# Patient Record
Sex: Male | Born: 1995 | Race: White | Hispanic: No | Marital: Single | Smoking: Never smoker
Health system: Southern US, Community
[De-identification: ages and names within clinical notes are randomized; demographics above are authoritative.]

---
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
## 2019-09-13 DIAGNOSIS — Z20828 Contact with and (suspected) exposure to other viral communicable diseases: Secondary | ICD-10-CM | POA: Diagnosis not present

## 2019-12-15 ENCOUNTER — Encounter: Payer: Self-pay | Admitting: Family Medicine

## 2019-12-15 ENCOUNTER — Ambulatory Visit (INDEPENDENT_AMBULATORY_CARE_PROVIDER_SITE_OTHER): Payer: BC Managed Care – PPO | Admitting: Family Medicine

## 2019-12-15 ENCOUNTER — Other Ambulatory Visit: Payer: Self-pay

## 2019-12-15 VITALS — BP 120/68 | HR 94 | Temp 98.2°F | Ht 72.5 in | Wt 170.2 lb

## 2019-12-15 DIAGNOSIS — K4021 Bilateral inguinal hernia, without obstruction or gangrene, recurrent: Secondary | ICD-10-CM | POA: Diagnosis not present

## 2019-12-15 DIAGNOSIS — Z114 Encounter for screening for human immunodeficiency virus [HIV]: Secondary | ICD-10-CM | POA: Diagnosis not present

## 2019-12-15 DIAGNOSIS — R350 Frequency of micturition: Secondary | ICD-10-CM

## 2019-12-15 DIAGNOSIS — Z0000 Encounter for general adult medical examination without abnormal findings: Secondary | ICD-10-CM

## 2019-12-15 LAB — POCT URINALYSIS DIPSTICK
Bilirubin, UA: NEGATIVE
Blood, UA: NEGATIVE
Glucose, UA: NEGATIVE
Ketones, UA: NEGATIVE
Nitrite, UA: NEGATIVE
Protein, UA: NEGATIVE
Spec Grav, UA: 1.02 (ref 1.010–1.025)
Urobilinogen, UA: 0.2 E.U./dL
pH, UA: 6 (ref 5.0–8.0)

## 2019-12-15 NOTE — Progress Notes (Signed)
Patient: Donald Page MRN: [id number] DOB: Jun 30, 1996 PCP: Orland Mustard, MD     Subjective:  Chief Complaint  Patient presents with  . Annual Exam  . Inguinal Hernia    Bilateral sides  . Urinary Frequency    HPI: The patient is a 24 y.o. male who presents today for annual exam. He denies any changes to past medical history. There have been no recent hospitalizations. They are following a well balanced diet and exercise plan. Pt lifts weights 5 days a week. He also runs or cycle everyday.  Weight has been stable. He has complaints of possible hernia and urinary frequency.   Has his second covid shot next week.   No family history of colon cancer or breast cancer in first degree relative. Grandfather with prostate cancer and his father had HTN.   Urinary frequency: it has been going on x couple years. It is starting to impact his day to day operation. He states he goes about 3x/night and it's worse during the night and really inhibits his sleeping. During the day he sometimes has to go 2x/hour. He has problems with long bike rides. He drinks 1/2-1 gallon of water/day. He drinks one cup of coffee in the AM. He does not really drink tea/soda ever. No coffee during the day. He goes when he needs to go the bathroom. He sometimes struggles with holding during hour long meetings. No correlation with when he started to really cycle. He states his mom told him he peed a lot as a child and the pediatrician told them he was fine.    Bilateral inguinal hernias: he states he noticed one side in college and then the other side later. He states there was never a single event that happened, but it came off after he started to lift weights. He states they do not hurt and never caused him any pain. He feels bilateral bulges if he bears down on both side. No problem going to the bathroom or having a BM.   There is no immunization history on file for this patient.  Colonoscopy: routine screening  HIV:  tested today.     Review of Systems  Constitutional: Negative for chills, fatigue and fever.  HENT: Positive for sneezing. Negative for dental problem, ear pain, hearing loss, sore throat, tinnitus and trouble swallowing.   Eyes: Negative for visual disturbance.  Respiratory: Negative for cough, chest tightness, shortness of breath and wheezing.   Cardiovascular: Negative for chest pain, palpitations and leg swelling.  Gastrointestinal: Negative for abdominal pain, blood in stool, diarrhea and nausea.  Endocrine: Negative for cold intolerance, polydipsia, polyphagia and polyuria.  Genitourinary: Positive for frequency. Negative for decreased urine volume, difficulty urinating, discharge, dysuria, enuresis, flank pain, hematuria, testicular pain and urgency.       +nocturia  Musculoskeletal: Negative for arthralgias and back pain.  Skin: Negative for rash.  Neurological: Negative for dizziness, light-headedness and headaches.  Hematological: Negative for adenopathy.  Psychiatric/Behavioral: Negative for dysphoric mood and sleep disturbance. The patient is not nervous/anxious.     Allergies Patient has No Known Allergies.  Past Medical History Patient  has no past medical history on file.  Surgical History Patient  has no past surgical history on file.  Family History Pateint's family history includes Cancer in his mother; Hypertension in his father.  Social History Patient  reports that he has never smoked. He has never used smokeless tobacco. He reports current alcohol use. He reports that he does not use drugs.  Objective: Vitals:   12/15/19 1408 12/15/19 1445  BP: 140/68 120/68  Pulse: 94   Temp: 98.2 F (36.8 C)   TempSrc: Temporal   SpO2: 98%   Weight: 170 lb 3.2 oz (77.2 kg)   Height: 6' 0.5" (1.842 m)     Body mass index is 22.77 kg/m.  Physical Exam Vitals reviewed.  Constitutional:      Appearance: Normal appearance. He is well-developed and normal  weight.  HENT:     Head: Normocephalic and atraumatic.     Right Ear: Tympanic membrane, ear canal and external ear normal.     Left Ear: Tympanic membrane, ear canal and external ear normal.     Nose: Nose normal.     Mouth/Throat:     Mouth: Mucous membranes are moist.  Eyes:     Extraocular Movements: Extraocular movements intact.     Conjunctiva/sclera: Conjunctivae normal.     Pupils: Pupils are equal, round, and reactive to light.  Neck:     Thyroid: No thyromegaly.  Cardiovascular:     Rate and Rhythm: Normal rate and regular rhythm.     Pulses: Normal pulses.     Heart sounds: Normal heart sounds. No murmur.  Pulmonary:     Effort: Pulmonary effort is normal.     Breath sounds: Normal breath sounds.  Abdominal:     General: Abdomen is flat. Bowel sounds are normal. There is no distension.     Palpations: Abdomen is soft.     Tenderness: There is no abdominal tenderness. There is no guarding or rebound.     Hernia: A hernia is present.     Comments: Bilateral reducible inguinal hernias   Musculoskeletal:        General: Normal range of motion.     Cervical back: Normal range of motion and neck supple.  Lymphadenopathy:     Cervical: No cervical adenopathy.  Skin:    General: Skin is warm and dry.     Capillary Refill: Capillary refill takes less than 2 seconds.     Findings: No rash.  Neurological:     General: No focal deficit present.     Mental Status: He is alert and oriented to person, place, and time.     Cranial Nerves: No cranial nerve deficit.     Coordination: Coordination normal.     Deep Tendon Reflexes: Reflexes normal.  Psychiatric:        Mood and Affect: Mood normal.        Behavior: Behavior normal.      Office Visit from 12/15/2019 in [facility]  PHQ-2 Total Score  0          Assessment/plan: 1. Annual physical exam Routine labs today. He is not fasting, but discussed okay to check. Very healthy. Continue healthy  lifestyle/eating. HIV screen, needs tdap, but getting second covid shot. Discussed can do this 4 weeks after covid vaccine. F/u in one year or as needed.  - CBC with Differential/Platelet - Comprehensive metabolic panel - TSH - Lipid panel  2. Encounter for screening for HIV  - HIV Antibody (routine testing w rflx)  3. Urinary frequency I wonder if the hernias contribute to this feeling. Checking sugar. Urine looks fine. Checking culture and PSA. Will see what surgeon says and if this could be a contributing factor. Discussed will send to urology as he may need bladder imaging and further work up.  - POCT urinalysis dipstick - Urine Culture -  PSA  4. Bilateral recurrent inguinal hernia without obstruction or gangrene  - Ambulatory referral to General Surgery    This visit occurred during the SARS-CoV-2 public health emergency.  Safety protocols were in place, including screening questions prior to the visit, additional usage of staff PPE, and extensive cleaning of exam room while observing appropriate contact time as indicated for disinfecting solutions.     Return if symptoms worsen or fail to improve.     Orma Flaming, MD [place]  12/15/2019

## 2019-12-15 NOTE — Patient Instructions (Signed)
Labs today.  Referral to surgery for hernias.    Inguinal Hernia, Adult An inguinal hernia is when fat or your intestines push through a weak spot in a muscle where your leg meets your lower belly (groin). This causes a rounded lump (bulge). This kind of hernia could also be:  In your scrotum, if you are male.  In folds of skin around your vagina, if you are male. There are three types of inguinal hernias. These include:  Hernias that can be pushed back into the belly (are reducible). This type rarely causes pain.  Hernias that cannot be pushed back into the belly (are incarcerated).  Hernias that cannot be pushed back into the belly and lose their blood supply (are strangulated). This type needs emergency surgery. If you do not have symptoms, you may not need treatment. If you have symptoms or a large hernia, you may need surgery. Follow these instructions at home: Lifestyle  Do these things if told by your doctor so you do not have trouble pooping (constipation): ? Drink enough fluid to keep your pee (urine) pale yellow. ? Eat foods that have a lot of fiber. These include fresh fruits and vegetables, whole grains, and beans. ? Limit foods that are high in fat and processed sugars. These include foods that are fried or sweet. ? Take medicine for trouble pooping.  Avoid lifting heavy objects.  Avoid standing for long amounts of time.  Do not use any products that contain nicotine or tobacco. These include cigarettes and e-cigarettes. If you need help quitting, ask your doctor.  Stay at a healthy weight. General instructions  You may try to push your hernia in by very gently pressing on it when you are lying down. Do not try to force the bulge back in if it will not push in easily.  Watch your hernia for any changes in shape, size, or color. Tell your doctor if you see any changes.  Take over-the-counter and prescription medicines only as told by your doctor.  Keep all  follow-up visits as told by your doctor. This is important. Contact a doctor if:  You have a fever.  You have new symptoms.  Your symptoms get worse. Get help right away if:  The area where your leg meets your lower belly has: ? Pain that gets worse suddenly. ? A bulge that gets bigger suddenly, and it does not get smaller after that. ? A bulge that turns red or purple. ? A bulge that is painful when you touch it.  You are a man, and your scrotum: ? Suddenly feels painful. ? Suddenly changes in size.  You cannot push the hernia in by very gently pressing on it when you are lying down. Do not try to force the bulge back in if it will not push in easily.  You feel sick to your stomach (nauseous), and that feeling does not go away.  You throw up (vomit), and that keeps happening.  You have a fast heartbeat.  You cannot poop (have a bowel movement) or pass gas. These symptoms may be an emergency. Do not wait to see if the symptoms will go away. Get medical help right away. Call your local emergency services (911 in the [place] Summary  An inguinal hernia is when fat or your intestines push through a weak spot in a muscle where your leg meets your lower belly (groin). This causes a rounded lump (bulge).  If you do not have symptoms, you may not  need treatment. If you have symptoms or a large hernia, you may need surgery.  Avoid lifting heavy objects. Also avoid standing for long amounts of time.  Do not try to force the bulge back in if it will not push in easily. This information is not intended to replace advice given to you by your health care provider. Make sure you discuss any questions you have with your health care provider. Document Revised: 10/04/2017 Document Reviewed: 06/04/2017 Elsevier Patient Education  2020 [employer] 92-80 Years Old, Male Preventive care refers to lifestyle choices and visits with your health care provider that can promote  health and wellness. This includes:  A yearly physical exam. This is also called an annual well check.  Regular dental and eye exams.  Immunizations.  Screening for certain conditions.  Healthy lifestyle choices, such as eating a healthy diet, getting regular exercise, not using drugs or products that contain nicotine and tobacco, and limiting alcohol use. What can I expect for my preventive care visit? Physical exam Your health care provider will check:  Height and weight. These may be used to calculate body mass index (BMI), which is a measurement that tells if you are at a healthy weight.  Heart rate and blood pressure.  Your skin for abnormal spots. Counseling Your health care provider may ask you questions about:  Alcohol, tobacco, and drug use.  Emotional well-being.  Home and relationship well-being.  Sexual activity.  Eating habits.  Work and work Statistician. What immunizations do I need?  Influenza (flu) vaccine  This is recommended every year. Tetanus, diphtheria, and pertussis (Tdap) vaccine  You may need a Td booster every 10 years. Varicella (chickenpox) vaccine  You may need this vaccine if you have not already been vaccinated. Human papillomavirus (HPV) vaccine  If recommended by your health care provider, you may need three doses over 6 months. Measles, mumps, and rubella (MMR) vaccine  You may need at least one dose of MMR. You may also need a second dose. Meningococcal conjugate (MenACWY) vaccine  One dose is recommended if you are 91-32 years old and a Market researcher living in a residence hall, or if you have one of several medical conditions. You may also need additional booster doses. Pneumococcal conjugate (PCV13) vaccine  You may need this if you have certain conditions and were not previously vaccinated. Pneumococcal polysaccharide (PPSV23) vaccine  You may need one or two doses if you smoke cigarettes or if you have  certain conditions. Hepatitis A vaccine  You may need this if you have certain conditions or if you travel or work in places where you may be exposed to hepatitis A. Hepatitis B vaccine  You may need this if you have certain conditions or if you travel or work in places where you may be exposed to hepatitis B. Haemophilus influenzae type b (Hib) vaccine  You may need this if you have certain risk factors. You may receive vaccines as individual doses or as more than one vaccine together in one shot (combination vaccines). Talk with your health care provider about the risks and benefits of combination vaccines. What tests do I need? Blood tests  Lipid and cholesterol levels. These may be checked every 5 years starting at age 83.  Hepatitis C test.  Hepatitis B test. Screening   Diabetes screening. This is done by checking your blood sugar (glucose) after you have not eaten for a while (fasting).  Sexually transmitted disease (STD) testing. Talk  with your health care provider about your test results, treatment options, and if necessary, the need for more tests. Follow these instructions at home: Eating and drinking   Eat a diet that includes fresh fruits and vegetables, whole grains, lean protein, and low-fat dairy products.  Take vitamin and mineral supplements as recommended by your health care provider.  Do not drink alcohol if your health care provider tells you not to drink.  If you drink alcohol: ? Limit how much you have to 0-2 drinks a day. ? Be aware of how much alcohol is in your drink. In the [place] one drink equals one 12 oz bottle of beer (355 mL), one 5 oz glass of wine (148 mL), or one 1 oz glass of hard liquor (44 mL). Lifestyle  Take daily care of your teeth and gums.  Stay active. Exercise for at least 30 minutes on 5 or more days each week.  Do not use any products that contain nicotine or tobacco, such as cigarettes, e-cigarettes, and chewing tobacco. If  you need help quitting, ask your health care provider.  If you are sexually active, practice safe sex. Use a condom or other form of protection to prevent STIs (sexually transmitted infections). What's next?  Go to your health care provider once a year for a well check visit.  Ask your health care provider how often you should have your eyes and teeth checked.  Stay up to date on all vaccines. This information is not intended to replace advice given to you by your health care provider. Make sure you discuss any questions you have with your health care provider. Document Revised: 08/20/2018 Document Reviewed: 08/20/2018 Elsevier Patient Education  2020 [employer].

## 2019-12-16 ENCOUNTER — Other Ambulatory Visit: Payer: Self-pay | Admitting: Family Medicine

## 2019-12-16 DIAGNOSIS — R350 Frequency of micturition: Secondary | ICD-10-CM

## 2019-12-16 LAB — COMPREHENSIVE METABOLIC PANEL
ALT: 27 U/L (ref 0–53)
AST: 37 U/L (ref 0–37)
Albumin: 5.2 g/dL (ref 3.5–5.2)
Alkaline Phosphatase: 70 U/L (ref 39–117)
BUN: 23 mg/dL (ref 6–23)
CO2: 28 mEq/L (ref 19–32)
Calcium: 10.3 mg/dL (ref 8.4–10.5)
Chloride: 99 mEq/L (ref 96–112)
Creatinine, Ser: 1.24 mg/dL (ref 0.40–1.50)
GFR: 70.85 mL/min (ref >60.00)
Glucose, Bld: 99 mg/dL (ref 70–99)
Potassium: 4.6 mEq/L (ref 3.5–5.1)
Sodium: 137 mEq/L (ref 135–145)
Total Bilirubin: 2 mg/dL — ABNORMAL HIGH (ref 0.2–1.2)
Total Protein: 7.4 g/dL (ref 6.0–8.3)

## 2019-12-16 LAB — CBC WITH DIFFERENTIAL/PLATELET
Basophils Absolute: 0 10*3/uL (ref 0.0–0.1)
Basophils Relative: 0.5 % (ref 0.0–3.0)
Eosinophils Absolute: 0.2 10*3/uL (ref 0.0–0.7)
Eosinophils Relative: 3 % (ref 0.0–5.0)
HCT: 43.1 % (ref 39.0–52.0)
Hemoglobin: 14.7 g/dL (ref 13.0–17.0)
Lymphocytes Relative: 27.5 % (ref 12.0–46.0)
Lymphs Abs: 1.7 10*3/uL (ref 0.7–4.0)
MCHC: 34.2 g/dL (ref 30.0–36.0)
MCV: 90.8 fl (ref 78.0–100.0)
Monocytes Absolute: 0.6 10*3/uL (ref 0.1–1.0)
Monocytes Relative: 10 % (ref 3.0–12.0)
Neutro Abs: 3.6 10*3/uL (ref 1.4–7.7)
Neutrophils Relative %: 59 % (ref 43.0–77.0)
Platelets: 258 10*3/uL (ref 150.0–400.0)
RBC: 4.74 Mil/uL (ref 4.22–5.81)
RDW: 12.4 % (ref 11.5–15.5)
WBC: 6.1 10*3/uL (ref 4.0–10.5)

## 2019-12-16 LAB — URINE CULTURE
MICRO NUMBER:: 10337304
Result:: NO GROWTH
SPECIMEN QUALITY:: ADEQUATE

## 2019-12-16 LAB — LIPID PANEL
Cholesterol: 166 mg/dL (ref 0–200)
HDL: 42.9 mg/dL (ref >39.00)
LDL Cholesterol: 105 mg/dL — ABNORMAL HIGH (ref 0–99)
NonHDL: 123.09
Total CHOL/HDL Ratio: 4
Triglycerides: 90 mg/dL (ref 0.0–149.0)
VLDL: 18 mg/dL (ref 0.0–40.0)

## 2019-12-16 LAB — HIV ANTIBODY (ROUTINE TESTING W REFLEX): HIV 1&2 Ab, 4th Generation: NONREACTIVE

## 2019-12-16 LAB — TSH: TSH: 1.14 u[IU]/mL (ref 0.35–4.50)

## 2019-12-16 LAB — PSA: PSA: 0.7 ng/mL (ref 0.10–4.00)

## 2020-03-20 DIAGNOSIS — R350 Frequency of micturition: Secondary | ICD-10-CM | POA: Diagnosis not present

## 2020-03-21 DIAGNOSIS — K4020 Bilateral inguinal hernia, without obstruction or gangrene, not specified as recurrent: Secondary | ICD-10-CM | POA: Diagnosis not present

## 2020-06-19 DIAGNOSIS — Z01818 Encounter for other preprocedural examination: Secondary | ICD-10-CM | POA: Diagnosis not present

## 2020-06-22 DIAGNOSIS — K4020 Bilateral inguinal hernia, without obstruction or gangrene, not specified as recurrent: Secondary | ICD-10-CM | POA: Diagnosis not present
